# Patient Record
Sex: Male | Born: 2000 | Race: Black or African American | Hispanic: No
Health system: Southern US, Community
[De-identification: ages and names within clinical notes are randomized; demographics above are authoritative.]

## PROBLEM LIST (undated history)

## (undated) HISTORY — PX: CIRCUMCISION: SUR203

---
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
## 2020-03-19 ENCOUNTER — Other Ambulatory Visit: Payer: Self-pay

## 2021-12-04 ENCOUNTER — Encounter: Payer: Self-pay | Admitting: Pediatric Endocrinology
